# Patient Record
Sex: Male | Born: 2005 | Race: Black or African American | Hispanic: No | Marital: Single
Health system: Midwestern US, Community
[De-identification: ages and names within clinical notes are randomized; demographics above are authoritative.]

## PROBLEM LIST (undated history)

## (undated) DIAGNOSIS — M93262 Osteochondritis dissecans, left knee: Secondary | ICD-10-CM

## (undated) DIAGNOSIS — M2342 Loose body in knee, left knee: Secondary | ICD-10-CM

---
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
## 2024-04-07 ENCOUNTER — Other Ambulatory Visit (HOSPITAL_COMMUNITY): Payer: Self-pay
